# Patient Record
Sex: Female | Born: 1960 | Race: White | Hispanic: No | Marital: Married | Smoking: Former smoker
Health system: Southern US, Community
[De-identification: ages and names within clinical notes are randomized; demographics above are authoritative.]

## PROBLEM LIST (undated history)

## (undated) DIAGNOSIS — N200 Calculus of kidney: Secondary | ICD-10-CM

## (undated) HISTORY — PX: TUBAL LIGATION: SHX77

---

## 2004-11-26 ENCOUNTER — Ambulatory Visit: Payer: Self-pay | Admitting: Unknown Physician Specialty

## 2005-06-27 ENCOUNTER — Emergency Department: Payer: Self-pay | Admitting: Emergency Medicine

## 2005-09-11 ENCOUNTER — Ambulatory Visit: Payer: Self-pay | Admitting: Unknown Physician Specialty

## 2006-04-27 ENCOUNTER — Emergency Department: Payer: Self-pay | Admitting: Unknown Physician Specialty

## 2017-08-04 ENCOUNTER — Ambulatory Visit: Payer: Self-pay | Admitting: Physician Assistant

## 2018-03-14 ENCOUNTER — Other Ambulatory Visit: Payer: Self-pay

## 2018-03-14 ENCOUNTER — Emergency Department
Admission: EM | Admit: 2018-03-14 | Discharge: 2018-03-14 | Disposition: A | Discharge status: Home / Self Care | Payer: Self-pay | Attending: Emergency Medicine | Admitting: Emergency Medicine

## 2018-03-14 DIAGNOSIS — L237 Allergic contact dermatitis due to plants, except food: Secondary | ICD-10-CM

## 2018-03-14 DIAGNOSIS — L03115 Cellulitis of right lower limb: Secondary | ICD-10-CM | POA: Insufficient documentation

## 2018-03-14 MED ORDER — DEXAMETHASONE SODIUM PHOSPHATE 10 MG/ML IJ SOLN
10.0000 mg | Freq: Once | INTRAMUSCULAR | Status: AC
  Administered 2018-03-14: 10 mg via INTRAMUSCULAR

## 2018-03-14 MED ORDER — SILVER SULFADIAZINE 1 % EX CREA: TOPICAL_CREAM | CUTANEOUS | 1 refills | Status: AC

## 2018-03-14 MED ORDER — CEPHALEXIN 500 MG PO CAPS
ORAL_CAPSULE | ORAL | Status: AC
  Filled 2018-03-14: qty 1

## 2018-03-14 MED ORDER — PREDNISONE 10 MG PO TABS: 10.0000 mg | ORAL_TABLET | Freq: Every day | ORAL | 0 refills | Status: DC

## 2018-03-14 MED ORDER — CEPHALEXIN 500 MG PO CAPS: 500.0000 mg | ORAL_CAPSULE | Freq: Four times a day (QID) | ORAL | 0 refills | Status: DC

## 2018-03-14 MED ORDER — CEPHALEXIN 500 MG PO CAPS
500.0000 mg | ORAL_CAPSULE | Freq: Once | ORAL | Status: AC
  Administered 2018-03-14: 500 mg via ORAL

## 2018-03-14 MED ORDER — CEPHALEXIN 500 MG PO CAPS: 500.0000 mg | ORAL_CAPSULE | Freq: Four times a day (QID) | ORAL | 0 refills | Status: AC

## 2018-03-14 MED ORDER — DEXAMETHASONE SODIUM PHOSPHATE 10 MG/ML IJ SOLN
INTRAMUSCULAR | Status: AC
  Filled 2018-03-14: qty 1

## 2018-03-14 MED ORDER — PREDNISONE 10 MG PO TABS: 10.0000 mg | ORAL_TABLET | Freq: Every day | ORAL | 0 refills | Status: AC

## 2018-03-14 NOTE — ED Triage Notes (Signed)
Pt in with poison ivy to legs bilat.

## 2018-03-14 NOTE — ED Provider Notes (Signed)
[facility] EMERGENCY DEPARTMENT Provider Note   CSN: [id number] Arrival date & time: 03/14/18  2046     History   Chief Complaint Chief Complaint  Patient presents with  . Poison Ivy    HPI Leslie Sparks is a 57 y.o. female presents to the emergency department for evaluation of poison ivy.  Patient states she has poison ivy to both legs is been present for 3 to 4 days.  She is been applying over-the-counter topical agents with no relief.  She denies any chest pain, shortness of breath, facial swelling, difficulty swallowing.  No history of anaphylaxis to poison ivy.  She has been afebrile.  She noticed behind the right leg she is had more redness and warmth and more irritation.  HPI  No past medical history on file.  There are no active problems to display for this patient.  OB History   None      Home Medications    Prior to Admission medications   Medication Sig Start Date End Date Taking? Authorizing Provider  cephALEXin (KEFLEX) 500 MG capsule Take 1 capsule (500 mg total) by mouth 4 (four) times daily for 7 days. 03/14/18 03/21/18  Evon Slack, PA-C  predniSONE (DELTASONE) 10 MG tablet Take 1 tablet (10 mg total) by mouth daily. 6,5,4,3,2,1 six day taper 03/14/18   Evon Slack, PA-C    Family History No family history on file.  Social History Social History   Tobacco Use  . Smoking status: Not on file  Substance Use Topics  . Alcohol use: Not on file  . Drug use: Not on file     Allergies   Patient has no allergy information on record.   Review of Systems Review of Systems  Constitutional: Negative for fever.  Eyes: Negative for itching.  Musculoskeletal: Negative for arthralgias, back pain, gait problem, joint swelling and myalgias.  Skin: Positive for rash and wound.  Neurological: Negative for headaches.     Physical Exam Updated Vital Signs BP 122/81 (BP Location: Left Arm)   Pulse 74   Temp 98.5 F  (36.9 C) (Oral)   Resp 20   Ht 5\' 4"  (1.626 m)   Wt 63.5 kg   SpO2 99%   BMI 24.03 kg/m   Physical Exam  Constitutional: She is oriented to person, place, and time. She appears well-developed and well-nourished.  HENT:  Head: Normocephalic and atraumatic.  Eyes: Conjunctivae are normal.  Neck: Normal range of motion.  Cardiovascular: Normal rate.  Pulmonary/Chest: Effort normal. No respiratory distress.  Musculoskeletal: Normal range of motion.  No joint effusions noted.  Compartments are soft, negative Homans sign.  Patient with diffuse contact dermatitis along both lower extremities most severe in the popliteal region of the right leg.  There are multiple vesicles with erythematous base.  Left leg has less erythematous rash with mild vesicles that have ruptured and are slightly weeping with erythema.  Neurological: She is alert and oriented to person, place, and time.  Skin: Skin is warm. No rash noted.  Psychiatric: She has a normal mood and affect. Her behavior is normal. Thought content normal.     ED Treatments / Results  Labs (all labs ordered are listed, but only abnormal results are displayed) Labs Reviewed - No data to display  EKG None  Radiology No results found.  Procedures Procedures (including critical care time)  Medications Ordered in ED Medications  dexamethasone (DECADRON) injection 10 mg (has no administration in time  range)  cephALEXin (KEFLEX) capsule 500 mg (has no administration in time range)     Initial Impression / Assessment and Plan / ED Course  I have reviewed the triage vital signs and the nursing notes.  Pertinent labs & imaging results that were available during my care of the patient were reviewed by me and considered in my medical decision making (see chart for details).     57 year old female with contact dermatitis to both legs most likely due to poison ivy.  Patient states she was walking in the woods and she feels as if this  is from poison ivy.  She is been scratching significantly and applying over-the-counter creams with not much relief.  She is given dexamethasone IM with a 6-day steroid taper along with cephalexin.  She appears to have rash consistent with contact dermatitis may be developing a secondary cellulitis to the right popliteal region of the right knee.  Patient does not appear to have any joint effusion.  Vital signs are stable.  She is stable and ready for discharge.  Final Clinical Impressions(s) / ED Diagnoses   Final diagnoses:  Cellulitis of right lower extremity  Poison ivy dermatitis    ED Discharge Orders         Ordered    predniSONE (DELTASONE) 10 MG tablet  Daily     03/14/18 2109    cephALEXin (KEFLEX) 500 MG capsule  4 times daily     03/14/18 2109           Evon Slack, PA-C 03/14/18 2113    Arnaldo Natal, MD 03/15/18 [phone]

## 2018-03-14 NOTE — ED Notes (Signed)
Pt states she feels fine. Pt with pwd skin and unlabored resps currently. No longer feels faint. Vss.

## 2018-03-14 NOTE — Discharge Instructions (Signed)
Please avoid hot showers, continue with topical cream such as calamine lotion to help dry up dermatitis.  Take antibiotics and prednisone as prescribed.  Return to the emergency department for any fevers worsening symptoms or urgent changes in your health.

## 2018-03-14 NOTE — ED Notes (Signed)
Pt with near syncopal episode after injection. Pt assisted to strecher with staff x2. Blood pressure 73/42, hr 40. Pt pale, sweaty. Pt placed in trendelenberg with improvement of symptoms.

## 2018-07-09 ENCOUNTER — Emergency Department: Payer: Self-pay

## 2018-07-09 ENCOUNTER — Other Ambulatory Visit: Payer: Self-pay

## 2018-07-09 ENCOUNTER — Emergency Department
Admission: EM | Admit: 2018-07-09 | Discharge: 2018-07-09 | Disposition: A | Discharge status: Home / Self Care | Payer: Self-pay | Attending: Emergency Medicine | Admitting: Emergency Medicine

## 2018-07-09 ENCOUNTER — Encounter: Payer: Self-pay | Admitting: Intensive Care

## 2018-07-09 DIAGNOSIS — M7918 Myalgia, other site: Secondary | ICD-10-CM | POA: Insufficient documentation

## 2018-07-09 DIAGNOSIS — R3915 Urgency of urination: Secondary | ICD-10-CM | POA: Insufficient documentation

## 2018-07-09 DIAGNOSIS — Z87891 Personal history of nicotine dependence: Secondary | ICD-10-CM | POA: Insufficient documentation

## 2018-07-09 HISTORY — DX: Calculus of kidney: N20.0

## 2018-07-09 LAB — URINALYSIS, COMPLETE (UACMP) WITH MICROSCOPIC
BILIRUBIN URINE: NEGATIVE
GLUCOSE, UA: NEGATIVE mg/dL
Hgb urine dipstick: NEGATIVE
Ketones, ur: 5 mg/dL — AB
LEUKOCYTES UA: NEGATIVE
NITRITE: NEGATIVE
PH: 9 — AB (ref 5.0–8.0)
Protein, ur: 30 mg/dL — AB
SPECIFIC GRAVITY, URINE: 1.019 (ref 1.005–1.030)

## 2018-07-09 LAB — COMPREHENSIVE METABOLIC PANEL
ALBUMIN: 4 g/dL (ref 3.5–5.0)
ALT: 14 U/L (ref 0–44)
ANION GAP: 6 (ref 5–15)
AST: 12 U/L — ABNORMAL LOW (ref 15–41)
Alkaline Phosphatase: 61 U/L (ref 38–126)
BUN: 13 mg/dL (ref 6–20)
CALCIUM: 8.7 mg/dL — AB (ref 8.9–10.3)
CHLORIDE: 107 mmol/L (ref 98–111)
CO2: 27 mmol/L (ref 22–32)
Creatinine, Ser: 0.58 mg/dL (ref 0.44–1.00)
GFR calc non Af Amer: >60 mL/min (ref >60)
GLUCOSE: 114 mg/dL — AB (ref 70–99)
POTASSIUM: 3.9 mmol/L (ref 3.5–5.1)
SODIUM: 140 mmol/L (ref 135–145)
Total Bilirubin: 0.4 mg/dL (ref 0.3–1.2)
Total Protein: 6.9 g/dL (ref 6.5–8.1)

## 2018-07-09 LAB — CBC
HEMATOCRIT: 40.5 % (ref 36.0–46.0)
HEMOGLOBIN: 13.7 g/dL (ref 12.0–15.0)
MCH: 29.8 pg (ref 26.0–34.0)
MCHC: 33.8 g/dL (ref 30.0–36.0)
MCV: 88.2 fL (ref 80.0–100.0)
PLATELETS: 355 10*3/uL (ref 150–400)
RBC: 4.59 MIL/uL (ref 3.87–5.11)
RDW: 12 % (ref 11.5–15.5)
WBC: 9.8 10*3/uL (ref 4.0–10.5)
nRBC: 0 % (ref 0.0–0.2)

## 2018-07-09 LAB — LIPASE, BLOOD: Lipase: 22 U/L (ref 11–51)

## 2018-07-09 MED ORDER — TRAMADOL HCL 50 MG PO TABS: 50.0000 mg | ORAL_TABLET | Freq: Four times a day (QID) | ORAL | 0 refills | Status: DC | PRN

## 2018-07-09 MED ORDER — CYCLOBENZAPRINE HCL 5 MG PO TABS: 5.0000 mg | ORAL_TABLET | Freq: Three times a day (TID) | ORAL | 0 refills | Status: AC | PRN

## 2018-07-09 MED ORDER — KETOROLAC TROMETHAMINE 60 MG/2ML IM SOLN
60.0000 mg | Freq: Once | INTRAMUSCULAR | Status: AC
  Administered 2018-07-09: 60 mg via INTRAMUSCULAR
  Filled 2018-07-09: qty 2

## 2018-07-09 NOTE — ED Provider Notes (Signed)
[facility] Emergency Department Provider Note  Time seen: 3:10 PM  I have reviewed the triage vital signs and the nursing notes.   HISTORY  Chief Complaint Flank Pain    HPI Leslie Sparks is a 58 y.o. female with a past medical history of a kidney stone greater than 10 years ago, presents to the emergency department for left flank pain.  According to the patient for the past 1 month she has been experiencing intermittent left flank pain, acutely worse over the past 1 week, and even more acutely worse over the past 8 hours.  States it is somewhat worse with movement, but does not feel like a muscle per patient.  Patient states a history of kidney stones approximately 15 years ago and it feels somewhat similar to that although the patient denies any dysuria or hematuria.  Has noted mild urgency at times.  Denies any vomiting, has noted some loose stool from time to time.  No fever.   Past Medical History:  Diagnosis Date  . Kidney stones     There are no active problems to display for this patient.   Past Surgical History:  Procedure Laterality Date  . TUBAL LIGATION      Prior to Admission medications   Medication Sig Start Date End Date Taking? Authorizing Provider  predniSONE (DELTASONE) 10 MG tablet Take 1 tablet (10 mg total) by mouth daily. 6,5,4,3,2,1 six day taper 03/14/18   Evon Slack, PA-C  silver sulfADIAZINE (SILVADENE) 1 % cream Apply to affected area daily 03/14/18 03/14/19  Evon Slack, PA-C    No Known Allergies  History reviewed. No pertinent family history.  Social History Social History   Tobacco Use  . Smoking status: Former Smoker    Last attempt to quit: 2010    Years since quitting: 10.0  . Smokeless tobacco: Never Used  Substance Use Topics  . Alcohol use: Yes    Comment: occ  . Drug use: Never    Review of Systems Constitutional: Negative for fever Cardiovascular: Negative for chest pain. Respiratory:  Negative for shortness of breath. Gastrointestinal: Left flank pain/back pain.  Negative for vomiting occasional loose stool. Genitourinary: No dysuria or hematuria, occasional urgency. Musculoskeletal: Left back pain Skin: Negative for skin complaints  Neurological: Negative for headache All other ROS negative  ____________________________________________   PHYSICAL EXAM:  VITAL SIGNS: ED Triage Vitals  Enc Vitals Group     BP 07/09/18 1327 (!) 126/106     Pulse Rate 07/09/18 1327 77     Resp --      Temp 07/09/18 1327 97.9 F (36.6 C)     Temp Source 07/09/18 1327 Oral     SpO2 07/09/18 1327 100 %     Weight 07/09/18 1327 145 lb (65.8 kg)     Height 07/09/18 1327 5\' 5"  (1.651 m)     Head Circumference --      Peak Flow --      Pain Score 07/09/18 1342 8     Pain Loc --      Pain Edu? --      Excl. in GC? --    Constitutional: Alert and oriented.  Mild distress due to discomfort. Eyes: Normal exam ENT   Head: Normocephalic and atraumatic.   Mouth/Throat: Mucous membranes are moist. Cardiovascular: Normal rate, regular rhythm. No murmur Respiratory: Normal respiratory effort without tachypnea nor retractions. Breath sounds are clear  Gastrointestinal: Soft, very minimal left lower quadrant tenderness  palpation, no rebound guarding or distention.  No CVA tenderness. Musculoskeletal: Nontender with normal range of motion in all extremities.  Neurologic:  Normal speech and language. No gross focal neurologic deficits  Skin:  Skin is warm, dry and intact.  Psychiatric: Mood and affect are normal.  ____________________________________________    RADIOLOGY  CT is essentially negative  ____________________________________________   INITIAL IMPRESSION / ASSESSMENT AND PLAN / ED COURSE  Pertinent labs & imaging results that were available during my care of the patient were reviewed by me and considered in my medical decision making (see chart for  details).  Patient presents to the emergency department for left flank pain x1 month although worse over the past 1 week and even worse over the past 8 hours.  Differential at this time would include ureterolithiasis, UTI/pyelonephritis, musculoskeletal pain, colitis or diverticulitis.  We will obtain labs, CT renal scan, treat with Toradol and continue to closely monitor.  Patient agreeable to plan of care.  CT scan is essentially negative.  Patient's lab work-up and urinalysis is nonrevealing and reassuring.  Patient's description of the pain worse with movement especially with bending, highly suggest musculoskeletal pain especially with a reassuring work-up.  We will place the patient on a short course of Ultram and Flexeril.  I discussed not drinking alcohol or driving while taking these medications.  I also discussed orthopedic follow-up if the patient continues to have pain after the next 2 weeks.  Patient agreeable to plan of care.  ____________________________________________   FINAL CLINICAL IMPRESSION(S) / ED DIAGNOSES  Left flank pain Musculoskeletal pain   Minna AntisPaduchowski, Ein Rijo, MD 07/09/18 [phone]

## 2018-07-09 NOTE — ED Notes (Signed)
First RN: pt c/o flank pain x 2 weeks. Denies fever.

## 2018-07-09 NOTE — ED Triage Notes (Signed)
Patient c/o lower back pain with some radiation to front. Has urinary frequency with little results.

## 2018-07-11 LAB — URINE CULTURE

## 2021-08-11 ENCOUNTER — Emergency Department
Admission: EM | Admit: 2021-08-11 | Discharge: 2021-08-11 | Disposition: A | Discharge status: Home / Self Care | Payer: Self-pay | Attending: Emergency Medicine | Admitting: Emergency Medicine

## 2021-08-11 ENCOUNTER — Other Ambulatory Visit: Payer: Self-pay

## 2021-08-11 DIAGNOSIS — L0100 Impetigo, unspecified: Secondary | ICD-10-CM | POA: Insufficient documentation

## 2021-08-11 MED ORDER — MUPIROCIN CALCIUM 2 % EX CREA: 1.0000 "application " | TOPICAL_CREAM | Freq: Two times a day (BID) | CUTANEOUS | 0 refills | Status: AC

## 2021-08-11 MED ORDER — SULFAMETHOXAZOLE-TRIMETHOPRIM 800-160 MG PO TABS: 1.0000 | ORAL_TABLET | Freq: Two times a day (BID) | ORAL | 0 refills | Status: AC

## 2021-08-11 NOTE — ED Triage Notes (Signed)
Pt presents to ED with concerns for impetigo on chin. Pt does present with rash and open areas to the chin. Pt denies fevers or chins. Pt states this started Thursday. NAD noted.  ?

## 2021-08-11 NOTE — ED Provider Notes (Signed)
? ?  [facility] ?Provider Note ? ? ? Event Date/Time  ? First MD Initiated Contact with Patient 08/11/21 1750   ?  (approximate) ? ? ?History  ? ?Rash ? ? ?HPI ?Leslie Sparks is a 61 y.o. female   patient complain of rash anterior inferior mandible area.  Onset of complaint was 4 days ago.  Patient that lesion started crusted over last night.  States no improvement over-the-counter antibiotic creams. ? ?  ? ? ?Physical Exam  ? ?Triage Vital Signs: ?ED Triage Vitals [08/11/21 1727]  ?Enc Vitals Group  ?   BP (!) 140/99  ?   Pulse Rate 67  ?   Resp 18  ?   Temp 99 ?F (37.2 ?C)  ?   Temp Source Oral  ?   SpO2 95 %  ?   Weight   ?   Height   ?   Head Circumference   ?   Peak Flow   ?   Pain Score 4  ?   Pain Loc   ?   Pain Edu?   ?   Excl. in GC?   ? ? ?Most recent vital signs: ?Vitals:  ? 08/11/21 1727  ?BP: (!) 140/99  ?Pulse: 67  ?Resp: 18  ?Temp: 99 ?F (37.2 ?C)  ?SpO2: 95%  ? ? ?General: Awake, no distress.  ?CV:  Good peripheral perfusion.  ?Resp:  Normal effort.  ?Abd:  No distention.  ?Other:  Multiple honey load crusted lesions lower anterior mandible ? ? ?ED Results / Procedures / Treatments  ? ?Labs ?(all labs ordered are listed, but only abnormal results are displayed) ?Labs Reviewed - No data to display ? ? ?EKG ? ? ? ? ?RADIOLOGY ? ? ? ?PROCEDURES: ? ?Critical Care performed: No ? ?Procedures ? ? ?MEDICATIONS ORDERED IN ED: ?Medications - No data to display ? ? ?IMPRESSION / MDM / ASSESSMENT AND PLAN / ED COURSE  ?I reviewed the triage vital signs and the nursing notes. ?             ?               ? ?Differential diagnosis includes, but is not limited to, bacterial versus viral skin infection. ? ? ?  ? ? ?FINAL CLINICAL IMPRESSION(S) / ED DIAGNOSES  ? ?Final diagnoses:  ?Impetigo  ? ? ? ?Rx / DC Orders  ? ?ED Discharge Orders   ? ?      Ordered  ?  mupirocin cream (BACTROBAN) 2 %  2 times daily       ? 08/11/21 1757  ?  sulfamethoxazole-trimethoprim (BACTRIM DS) 800-160 MG tablet   2 times daily       ? 08/11/21 1757  ? ?  ?  ? ?  ? ? ? ?Note:  This document was prepared using Dragon voice recognition software and may include unintentional dictation errors.  ?  ?Joni Reining, PA-C ?08/11/21 1800 ? ?  ?Sharyn Creamer, MD ?08/11/21 2259 ? ?

## 2021-08-11 NOTE — Discharge Instructions (Signed)
Read and follow discharge care instructions.  Take medication as directed for ?

## 2021-09-16 ENCOUNTER — Emergency Department: Payer: Self-pay

## 2021-09-16 ENCOUNTER — Emergency Department
Admission: EM | Admit: 2021-09-16 | Discharge: 2021-09-16 | Disposition: A | Discharge status: Home / Self Care | Payer: Self-pay | Attending: Student in an Organized Health Care Education/Training Program | Admitting: Student in an Organized Health Care Education/Training Program

## 2021-09-16 ENCOUNTER — Other Ambulatory Visit: Payer: Self-pay

## 2021-09-16 DIAGNOSIS — W010XXA Fall on same level from slipping, tripping and stumbling without subsequent striking against object, initial encounter: Secondary | ICD-10-CM | POA: Insufficient documentation

## 2021-09-16 DIAGNOSIS — S52502A Unspecified fracture of the lower end of left radius, initial encounter for closed fracture: Secondary | ICD-10-CM

## 2021-09-16 MED ORDER — OXYCODONE-ACETAMINOPHEN 5-325 MG PO TABS: 1.0000 | ORAL_TABLET | ORAL | 0 refills | Status: AC | PRN

## 2021-09-16 NOTE — ED Triage Notes (Signed)
Pt states slipped and fell on a dog toy last night and injured her left wrist/FA, pt has noted swelling and deformity. States she had a head light out and scared to drive last night. ?

## 2021-09-16 NOTE — ED Notes (Signed)
Dc instructions and scripts reviewed with pt. Will follow up with orthopedic surgeon today.  ?

## 2021-09-16 NOTE — ED Provider Notes (Signed)
? ?[facility] ?Provider Note ? ? ? Event Date/Time  ? First MD Initiated Contact with Patient 09/16/21 [phone]   ?  (approximate) ? ? ?History  ? ?Wrist Pain ? ? ?HPI ? ?Leslie Sparks is a 61 y.o. female no significant past medical history presents to the ER for evaluation of left wrist and forearm pain that occurred after mechanical fall last night.  She was reaching out to pick up a dog toy and tripped.  Did not hit her head denies any neck pain.  No shoulder or elbow pain.  Has had previous surgery on the left elbow.  Not any blood thinners.  States the pain is minimal when she is not moving it. ?  ? ? ?Physical Exam  ? ?Triage Vital Signs: ?ED Triage Vitals [09/16/21 0718]  ?Enc Vitals Group  ?   BP (!) 148/94  ?   Pulse Rate 72  ?   Resp 17  ?   Temp 98.5 ?F (36.9 ?C)  ?   Temp Source Oral  ?   SpO2 99 %  ?   Weight   ?   Height   ?   Head Circumference   ?   Peak Flow   ?   Pain Score   ?   Pain Loc   ?   Pain Edu?   ?   Excl. in GC?   ? ? ?Most recent vital signs: ?Vitals:  ? 09/16/21 0718  ?BP: (!) 148/94  ?Pulse: 72  ?Resp: 17  ?Temp: 98.5 ?F (36.9 ?C)  ?SpO2: 99%  ? ? ? ?Constitutional: Alert  ?Eyes: Conjunctivae are normal.  ?Head: Atraumatic. ?Nose: No congestion/rhinnorhea. ?Mouth/Throat: Mucous membranes are moist.   ?Neck: Painless ROM.  ?Cardiovascular:   Good peripheral circulation. ?Respiratory: Normal respiratory effort.  No retractions.  ?Gastrointestinal: Soft and nontender.  ?Musculoskeletal:  dinner fork deformity to left distal wrist with swelling and ecchymosis, n/v intact ?Neurologic:  MAE spontaneously. No gross focal neurologic deficits are appreciated.  ?Skin:  Skin is warm, dry and intact. No rash noted. ?Psychiatric: Mood and affect are normal. Speech and behavior are normal. ? ? ? ?ED Results / Procedures / Treatments  ? ?Labs ?(all labs ordered are listed, but only abnormal results are displayed) ?Labs Reviewed - No data to  display ? ? ?EKG ? ? ? ? ?RADIOLOGY ?Please see ED Course for my review and interpretation. ? ?I personally reviewed all radiographic images ordered to evaluate for the above acute complaints and reviewed radiology reports and findings.  These findings were personally discussed with the patient.  Please see medical record for radiology report. ? ? ? ?PROCEDURES: ? ?Critical Care performed: No ? ?.Ortho Injury Treatment ? ?Date/Time: 09/16/2021 8:45 AM ?Performed by: Willy Eddy, MD ?Authorized by: Willy Eddy, MD  ? ?Consent:  ?  Consent obtained:  VerbalInjury location: forearm ?Location details: left forearm ?Injury type: fracture ?Fracture type: distal radius ?Pre-procedure neurovascular assessment: neurovascularly intact ?Splint type: sugar tong ?Splint Applied by: ED Provider ?Supplies used: Ortho-Glass ?Post-procedure neurovascular assessment: post-procedure neurovascularly intact ? ? ? ? ?MEDICATIONS ORDERED IN ED: ?Medications - No data to display ? ? ?IMPRESSION / MDM / ASSESSMENT AND PLAN / ED COURSE  ?I reviewed the triage vital signs and the nursing notes. ?             ?               ? ?Differential diagnosis includes, but  is not limited to, fracture, dislocation, contusion, sprain ? ?Patient presenting with left wrist pain as described above.  No other associated injury.  Suspect distal radius fracture. ? ? ?Clinical Course as of 09/16/21 0845  ?Mon Sep 16, 2021  ?0800 X-ray to my review and interpretation does not show any evidence of significant displaced fracture, will await formal radiology report. [PR]  ?0932 Left forearm immobilized with sugar-tong splint.  Will be given outpatient follow-up. [PR]  ?  ?Clinical Course User Index ?[PR] Willy Eddy, MD  ? ? ? ?FINAL CLINICAL IMPRESSION(S) / ED DIAGNOSES  ? ?Final diagnoses:  ?Closed fracture of distal end of left radius, unspecified fracture morphology, initial encounter  ? ? ? ?Rx / DC Orders  ? ?ED Discharge Orders   ? ?       Ordered  ?  oxyCODONE-acetaminophen (PERCOCET) 5-325 MG tablet  Every 4 hours PRN       ? 09/16/21 0841  ? ?  ?  ? ?  ? ? ? ?Note:  This document was prepared using Dragon voice recognition software and may include unintentional dictation errors. ? ?  ?Willy Eddy, MD ?09/16/21 0845 ? ?

## 2022-05-29 ENCOUNTER — Encounter: Payer: Self-pay | Admitting: Emergency Medicine

## 2022-05-29 ENCOUNTER — Emergency Department: Payer: Self-pay

## 2022-05-29 ENCOUNTER — Emergency Department
Admission: EM | Admit: 2022-05-29 | Discharge: 2022-05-29 | Disposition: A | Discharge status: Home / Self Care | Payer: Self-pay | Attending: Emergency Medicine | Admitting: Emergency Medicine

## 2022-05-29 ENCOUNTER — Other Ambulatory Visit: Payer: Self-pay

## 2022-05-29 DIAGNOSIS — R051 Acute cough: Secondary | ICD-10-CM

## 2022-05-29 DIAGNOSIS — Z20822 Contact with and (suspected) exposure to covid-19: Secondary | ICD-10-CM | POA: Insufficient documentation

## 2022-05-29 DIAGNOSIS — J101 Influenza due to other identified influenza virus with other respiratory manifestations: Secondary | ICD-10-CM | POA: Insufficient documentation

## 2022-05-29 DIAGNOSIS — F17200 Nicotine dependence, unspecified, uncomplicated: Secondary | ICD-10-CM | POA: Insufficient documentation

## 2022-05-29 LAB — RESP PANEL BY RT-PCR (RSV, FLU A&B, COVID)  RVPGX2
Influenza A by PCR: POSITIVE — AB
Influenza B by PCR: NEGATIVE
Resp Syncytial Virus by PCR: NEGATIVE
SARS Coronavirus 2 by RT PCR: NEGATIVE

## 2022-05-29 NOTE — ED Provider Triage Note (Signed)
Emergency Medicine Provider Triage Evaluation Note  Leslie Sparks, a 61 y.o. female  was evaluated in triage.  Pt complains of cough, congestion, and fevers.  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  Review of Systems  Positive: Cough, fevers Negative: CP, SOB  Physical Exam  BP 129/78 (BP Location: Left Arm)   Pulse 92   Temp 98.1 F (36.7 C) (Oral)   Resp 18   Ht 5\' 5"  (1.651 m)   Wt 61.2 kg   SpO2 96%   BMI 22.47 kg/m  Gen:   Awake, no distress  NAD Resp:  Normal effort CTA MSK:   Moves extremities without difficulty  Other:    Medical Decision Making  Medically screening exam initiated at 1:25 PM.  Appropriate orders placed.  E Frerking was informed that the remainder of the evaluation will be completed by another provider, this initial triage assessment does not replace that evaluation, and the importance of remaining in the ED until their evaluation is complete.  Patient to the ED for evaluation of productive cough the last week with concern for possible viral infection exposure.   [place], PA-C 05/29/22 1326

## 2022-05-29 NOTE — ED Provider Notes (Signed)
[facility] Provider Note    Event Date/Time   First MD Initiated Contact with Patient 05/29/22 1519     (approximate)   History   Cough   HPI  Leslie Sparks is a 61 y.o. female with no significant past medical history who presents with cough.  Symptoms been going on for about 10 days to 2 weeks.  She has frequent cough especially at night.  Is productive of clear sputum.  Had low-grade fever yesterday.  Denies significant headache body aches.  No significant congestion.  Occasionally feels short of breath.  Not having hemoptysis.  No lower extremity swelling.  She smokes occasionally.  Denies sick contacts     Past Medical History:  Diagnosis Date   Kidney stones     There are no problems to display for this patient.    Physical Exam  Triage Vital Signs: ED Triage Vitals  Enc Vitals Group     BP 05/29/22 1317 129/78     Pulse Rate 05/29/22 1317 92     Resp 05/29/22 1317 18     Temp 05/29/22 1317 98.1 F (36.7 C)     Temp Source 05/29/22 1317 Oral     SpO2 05/29/22 1317 96 %     Weight 05/29/22 1315 135 lb (61.2 kg)     Height 05/29/22 1315 5\' 5"  (1.651 m)     Head Circumference --      Peak Flow --      Pain Score 05/29/22 1315 0     Pain Loc --      Pain Edu? --      Excl. in GC? --     Most recent vital signs: Vitals:   05/29/22 1317  BP: 129/78  Pulse: 92  Resp: 18  Temp: 98.1 F (36.7 C)  SpO2: 96%     General: Awake, no distress.  CV:  Good peripheral perfusion.  Resp:  Normal effort.  Lungs are clear no increased work of breathing Abd:  No distention.  Neuro:             Awake, Alert, Oriented x 3  Other:  No lower extremity swelling   ED Results / Procedures / Treatments  Labs (all labs ordered are listed, but only abnormal results are displayed) Labs Reviewed  RESP PANEL BY RT-PCR (RSV, FLU A&B, COVID)  RVPGX2 - Abnormal; Notable for the following components:      Result Value   Influenza A by PCR  POSITIVE (*)    All other components within normal limits     EKG     RADIOLOGY I reviewed and interpreted the CXR which does not show any acute cardiopulmonary process    PROCEDURES:  Critical Care performed: No  Procedures   MEDICATIONS ORDERED IN ED: Medications - No data to display   IMPRESSION / MDM / ASSESSMENT AND PLAN / ED COURSE  I reviewed the triage vital signs and the nursing notes.                              Patient's presentation is most consistent with acute complicated illness / injury requiring diagnostic workup.  Differential diagnosis includes, but is not limited to, bacterial pneumonia, viral illness, postviral cough, postnasal drip, GERD  Patient is 61 year old female who presents with about 2 weeks of cough that is productive of clear sputum.  Occasionally feels short of breath.  Felt  she had a low-grade fever yesterday.  She is not hypoxic or febrile vital signs are within normal limits.  Patient looks well is not in respiratory distress.  Lungs are clear my exam.  She is positive for flu A.  Chest x-ray obtained given duration of symptoms to rule out a post flu pneumonia and this is clear.  No indication for antibiotics at this time.  We discussed supportive measures for cough.  She is appropriate for discharge.       FINAL CLINICAL IMPRESSION(S) / ED DIAGNOSES   Final diagnoses:  Acute cough  Influenza A     Rx / DC Orders   ED Discharge Orders     None        Note:  This document was prepared using Dragon voice recognition software and may include unintentional dictation errors.   Georga Hacking, MD 05/29/22 [phone]

## 2022-05-29 NOTE — ED Triage Notes (Signed)
Patient to ED via POV for a cough x1 week. Patient states it is productive with white mucous. Has taken over the counter meds with no relief.

## 2022-05-29 NOTE — Discharge Instructions (Addendum)
Your influenza test was positive which is likely the cause of your cough.  You do not have pneumonia on your chest x-ray.  Continue over-the-counter medications for cough make sure you are staying hydrated.  Please follow-up with your primary care provider.

## 2022-07-02 ENCOUNTER — Other Ambulatory Visit: Payer: Self-pay

## 2022-07-02 ENCOUNTER — Emergency Department
Admission: EM | Admit: 2022-07-02 | Discharge: 2022-07-02 | Disposition: A | Discharge status: Home / Self Care | Payer: Self-pay | Attending: Emergency Medicine | Admitting: Emergency Medicine

## 2022-07-02 DIAGNOSIS — H109 Unspecified conjunctivitis: Secondary | ICD-10-CM | POA: Insufficient documentation

## 2022-07-02 MED ORDER — ERYTHROMYCIN 5 MG/GM OP OINT: 1.0000 | TOPICAL_OINTMENT | Freq: Every day | OPHTHALMIC | 0 refills | Status: AC

## 2022-07-02 NOTE — ED Triage Notes (Signed)
Pt comes with c/o bilateral eye pain redness. Pt states she knows it is pink eye and just needs prescription.

## 2022-07-02 NOTE — ED Provider Notes (Signed)
   [facility] Provider Note    Event Date/Time   First MD Initiated Contact with Patient 07/02/22 1627     (approximate)   History   Eye Pain   HPI  [place] is a 62 y.o. female who presents with red eyes bilaterally and has noted some discharge in the mornings.  She thinks she has conjunctivitis and is requesting medication.  She works in a daycare     Physical Exam   Triage Vital Signs: ED Triage Vitals [07/02/22 1515]  Enc Vitals Group     BP (!) 147/69     Pulse Rate 75     Resp 18     Temp 98.2 F (36.8 C)     Temp src      SpO2 97 %     Weight      Height      Head Circumference      Peak Flow      Pain Score 3     Pain Loc      Pain Edu?      Excl. in [place]?     Most recent vital signs: Vitals:   07/02/22 1515  BP: (!) 147/69  Pulse: 75  Resp: 18  Temp: 98.2 F (36.8 C)  SpO2: 97%     General: Awake, no distress.  CV:  Good peripheral perfusion.  Resp:  Normal effort.  Abd:  No distention.  Other:  Eyes: Mild conjunctivitis bilaterally   ED Results / Procedures / Treatments   Labs (all labs ordered are listed, but only abnormal results are displayed) Labs Reviewed - No data to display   EKG     RADIOLOGY     PROCEDURES:  Critical Care performed:   Procedures   MEDICATIONS ORDERED IN ED: Medications - No data to display   IMPRESSION / MDM / [place] / ED COURSE  I reviewed the triage vital signs and the nursing notes. Patient's presentation is most consistent with acute, uncomplicated illness.   Erythromycin ointment prescription provided       FINAL CLINICAL IMPRESSION(S) / ED DIAGNOSES   Final diagnoses:  Conjunctivitis of both eyes, unspecified conjunctivitis type     Rx / DC Orders   ED Discharge Orders          Ordered    erythromycin ophthalmic ointment  Daily at bedtime        07/02/22 1630             Note:  This document was prepared using  Dragon voice recognition software and may include unintentional dictation errors.   Lavonia Drafts, MD 07/02/22 [phone]

## 2023-06-08 IMAGING — DX DG WRIST COMPLETE 3+V*L*
4 series · 4 of 4 positions shown · non-contrast
Comparison: None.

CLINICAL DATA: Slipped and fell yesterday.  Swelling and deformity.

EXAM:
LEFT WRIST - COMPLETE 3+ VIEW

[wrist ap]
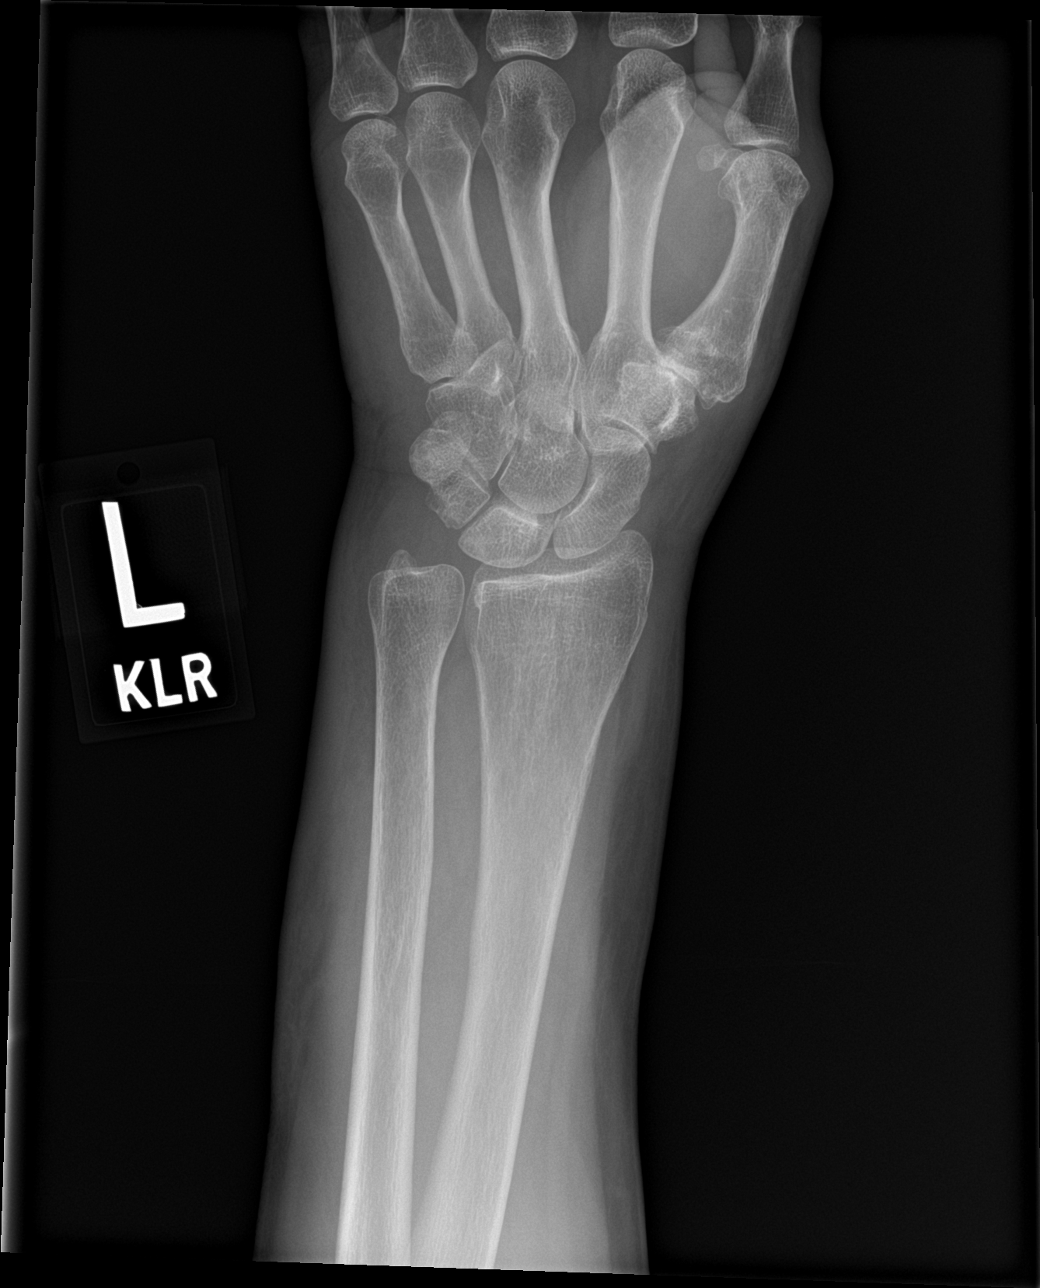

[wrist obl (1 of 2)]
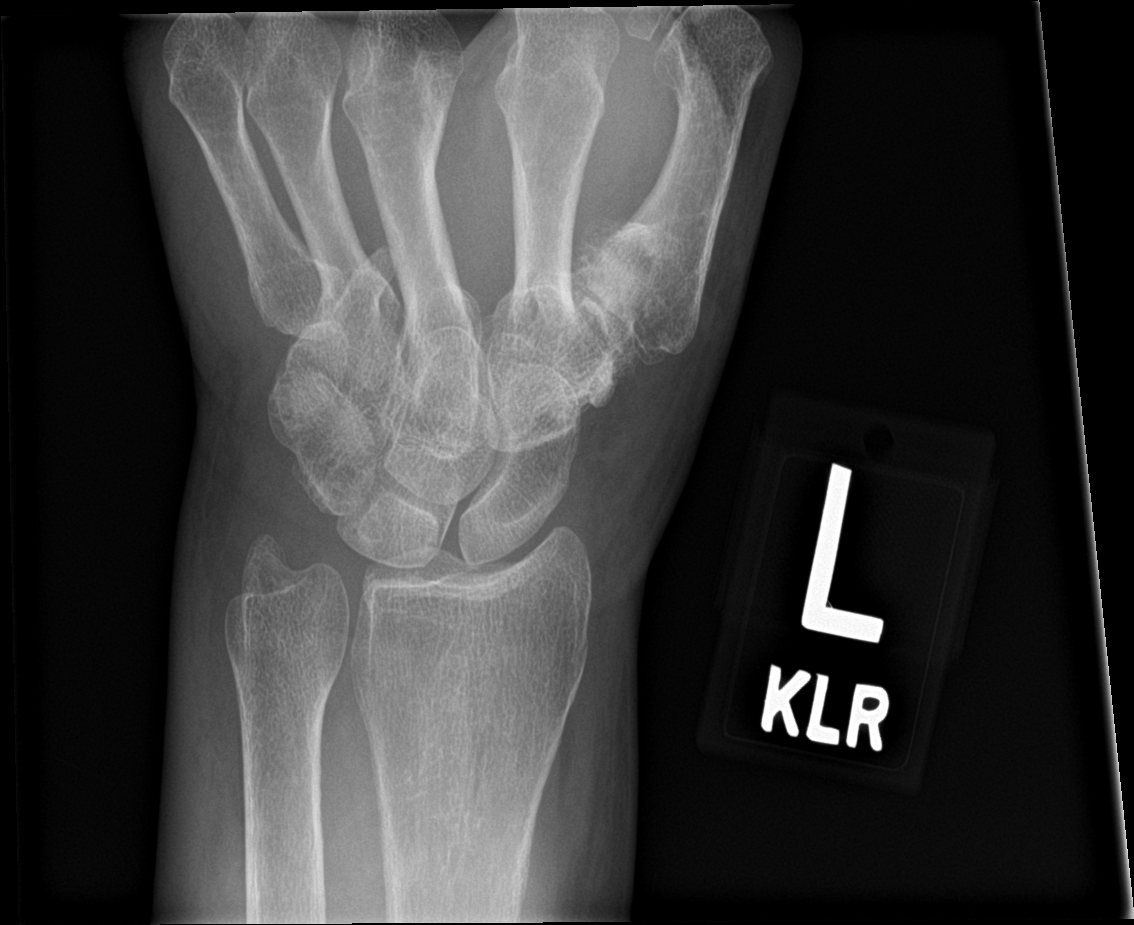

[wrist lat]
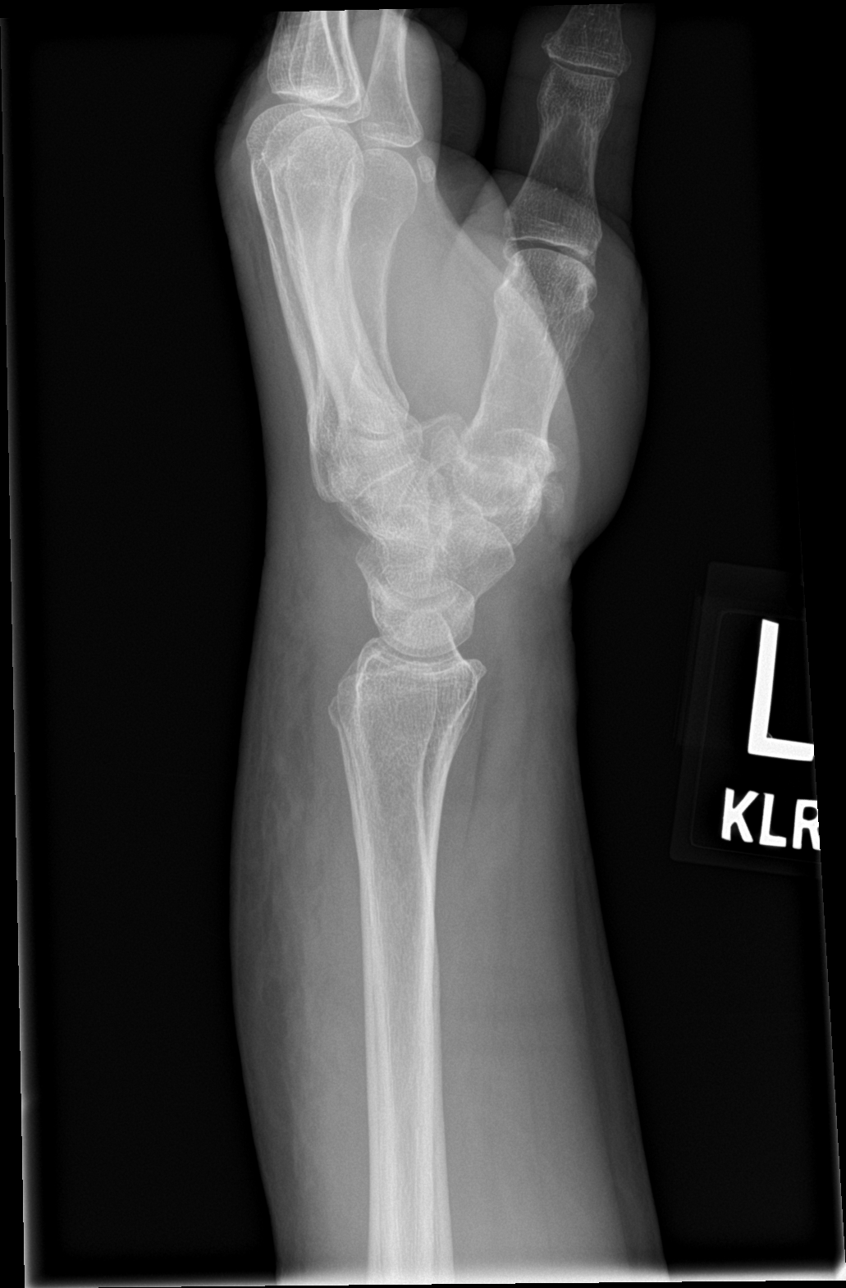

[wrist obl (2 of 2)]
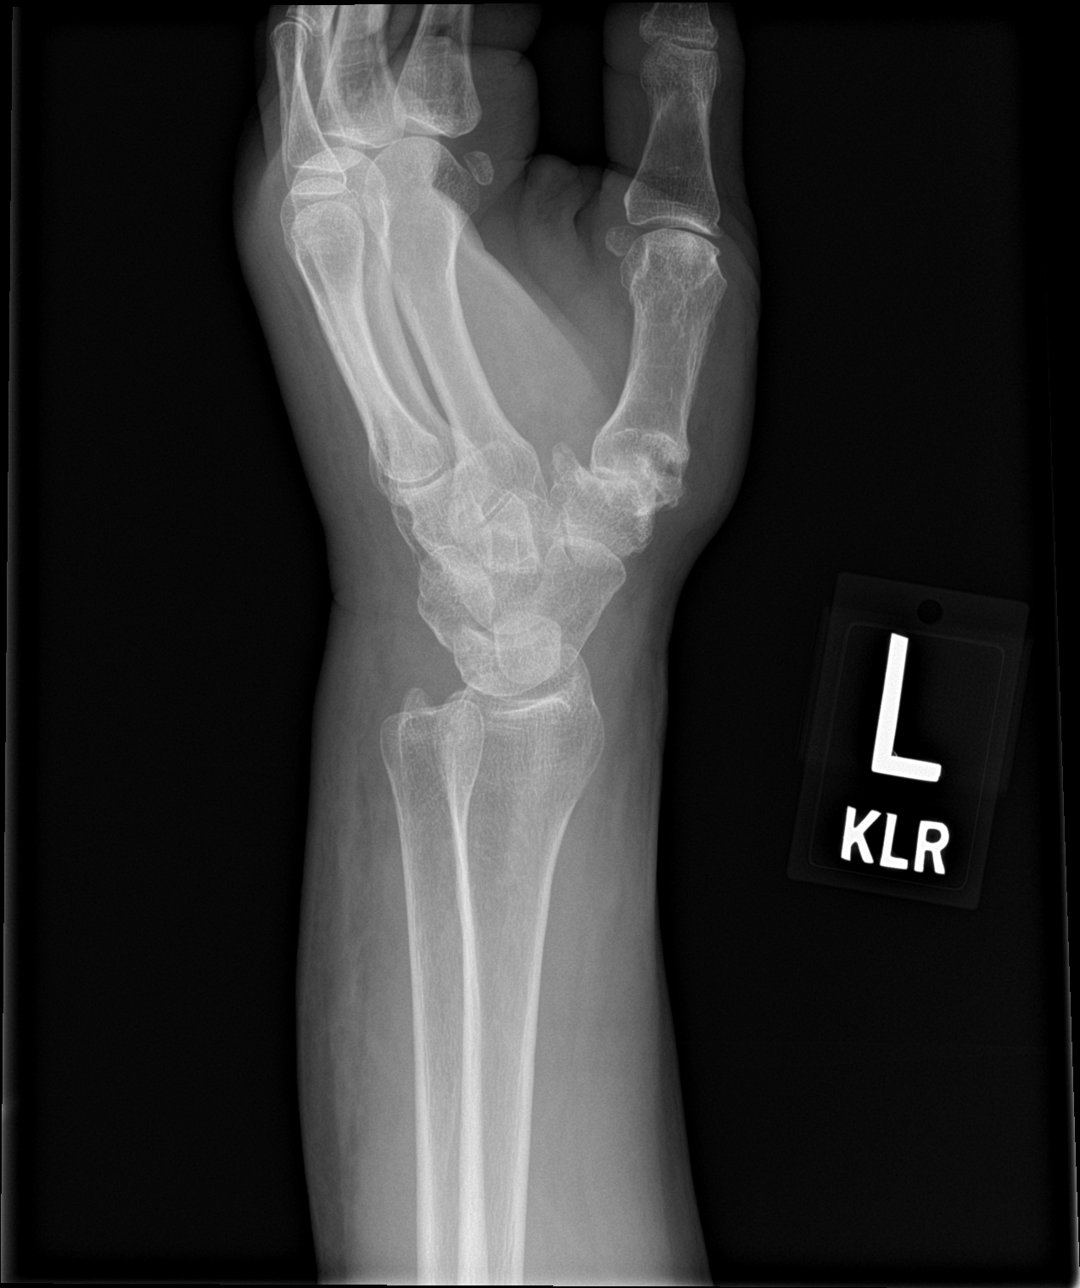

[4 of 4 positions shown; findings below may reference images not displayed]

FINDINGS: Pronounced distal soft tissue swelling. Suspicion of minimal dorsal
cortex buckle fracture of the distal radius. No ulnar styloid
fracture. No acute carpal finding. Chronic degenerative changes at
the first carpometacarpal joint.
IMPRESSION: Suspicion of minimal dorsal cortex buckle fracture of the distal
radius. Regional soft tissue swelling.

Chronic degenerative changes of the first carpometacarpal joint.

## 2024-05-09 ENCOUNTER — Emergency Department
Admission: EM | Admit: 2024-05-09 | Discharge: 2024-05-09 | Disposition: A | Discharge status: Home / Self Care | Attending: Emergency Medicine | Admitting: Emergency Medicine

## 2024-05-09 ENCOUNTER — Other Ambulatory Visit: Payer: Self-pay

## 2024-05-09 DIAGNOSIS — J028 Acute pharyngitis due to other specified organisms: Secondary | ICD-10-CM | POA: Diagnosis not present

## 2024-05-09 DIAGNOSIS — J029 Acute pharyngitis, unspecified: Secondary | ICD-10-CM | POA: Diagnosis present

## 2024-05-09 DIAGNOSIS — B9789 Other viral agents as the cause of diseases classified elsewhere: Secondary | ICD-10-CM | POA: Insufficient documentation

## 2024-05-09 LAB — GROUP A STREP BY PCR: Group A Strep by PCR: NOT DETECTED

## 2024-05-09 MED ORDER — ALUM & MAG HYDROXIDE-SIMETH 200-200-20 MG/5ML PO SUSP
30.0000 mL | Freq: Once | ORAL | Status: AC
  Administered 2024-05-09: 30 mL via ORAL
  Filled 2024-05-09: qty 30

## 2024-05-09 MED ORDER — LIDOCAINE VISCOUS HCL 2 % MT SOLN: 15.0000 mL | OROMUCOSAL | 0 refills | Status: AC | PRN

## 2024-05-09 MED ORDER — LIDOCAINE VISCOUS HCL 2 % MT SOLN
15.0000 mL | Freq: Once | OROMUCOSAL | Status: AC
  Administered 2024-05-09: 15 mL via ORAL
  Filled 2024-05-09: qty 15

## 2024-05-09 NOTE — ED Triage Notes (Signed)
 Pt reports sore throat cough and congestion for the past few days.

## 2024-05-09 NOTE — Discharge Instructions (Addendum)
 You have been diagnosed with viral pharyngitis.  Strep a came back negative.  Please take ibuprofen or acetaminophen  every 8 hours for sore throat and bodyaches.  Please take lidocaine for sore throat.  If the symptoms persist please go to your PCP or come back here to ED.  It was a pleasure to help you today.  Jasmeet Gehl, PA-C

## 2024-05-09 NOTE — ED Provider Notes (Signed)
 [facility] Provider Note    Event Date/Time   First MD Initiated Contact with Patient 05/09/24 2042     (approximate)   History   Sore Throat    HPI  Leslie Sparks is a 63 y.o. female    with a past medical history of cough, impetigo, cellulitis, who presents to the ED complaining of cough and congestion.. According to the patient, symptoms started 24 hours ago with sore throat, dry cough, chills.  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  Patient is here by herself.     There are no active problems to display for this patient.    Physical Exam   Triage Vital Signs: ED Triage Vitals [05/09/24 1950]  Encounter Vitals Group     BP (!) 144/74     Girls Systolic BP Percentile      Girls Diastolic BP Percentile      Boys Systolic BP Percentile      Boys Diastolic BP Percentile      Pulse Rate 79     Resp 18     Temp 98.4 F (36.9 C)     Temp src      SpO2 100 %     Weight 135 lb (61.2 kg)     Height 5' 4 (1.626 m)     Head Circumference      Peak Flow      Pain Score 7     Pain Loc      Pain Education      Exclude from Growth Chart     Most recent vital signs: Vitals:   05/09/24 1950  BP: (!) 144/74  Pulse: 79  Resp: 18  Temp: 98.4 F (36.9 C)  SpO2: 100%     Physical Exam Vitals and nursing note reviewed.  In triage patient has systolic hypertension.  General:          Awake, no distress.  Throat: Presence of peritonsillar erythema, no petechia's, no exudates.  No peritonsillar enlargement. CV:                  Good peripheral perfusion. Regular rate and rhythm. Resp:               Normal effort. no tachypnea.Equal breath sounds bilaterally.  No wheezing Abd:                 No distention.  Soft nontender          ED Results / Procedures / Treatments   Labs (all labs ordered are listed, but only abnormal results are displayed) Labs Reviewed  GROUP A STREP BY PCR    PROCEDURES:  Critical Care performed:    Procedures   MEDICATIONS ORDERED IN ED: Medications  alum & mag hydroxide-simeth (MAALOX/MYLANTA) 200-200-20 MG/5ML suspension 30 mL (has no administration in time range)    And  lidocaine (XYLOCAINE) 2 % viscous mouth solution 15 mL (has no administration in time range)   Clinical Course as of 05/09/24 2128  Mon May 09, 2024  2121 Group A Strep by PCR ([facility] Only) Negative [AE]  2121 Updated patient with results of strep  A.  Patient complains of sore throat.  I will order Maalox with lidocaine viscous.  Patient is going to be discharged with lidocaine viscous.  She is agreeable with the plan [AE]    Clinical Course User Index [AE] Janit Kast, PA-C    IMPRESSION / MDM /  ASSESSMENT AND PLAN / ED COURSE  I reviewed the triage vital signs and the nursing notes.  Differential diagnosis includes, but is not limited to, pharyngitis, viral illness, strep A, unlikely GERD  Patient's presentation is most consistent with acute complicated illness / injury requiring diagnostic workup.   Leslie Sparks is a 64 y.o., female who presents today with history of 24 hours of sore throat, dry cough, chills.  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  On a physical exam vital signs were normal during triage, throat presence of peritonsillar erythema, no tonsillar enlargement, no petechia, no exudate.  Rest of the physical exam is normal.  Plan Wait for results of the strep A ordered during triage. Strep is negative, patient complains of sore throat.  I will order Maalox with lidocaine. Patient's diagnosis is consistent with viral pharyngitis. I  Labs are  reassuring ruling out strep a. I did review the patient's allergies and medications.The patient is in stable and satisfactory condition for discharge home  Patient will be discharged home with prescriptions for lidocaine discussed. Patient is to follow up with PCP as needed or otherwise directed. Patient is given ED precautions to return to  the ED for any worsening or new symptoms. Discussed plan of care with patient, answered all of patient's questions, patient agreeable to plan of care. Advised patient to take medications according to the instructions on the label. Discussed possible side effects of new medications. Patient verbalized understanding.  FINAL CLINICAL IMPRESSION(S) / ED DIAGNOSES   Final diagnoses:  Viral pharyngitis     Rx / DC Orders   ED Discharge Orders          Ordered    lidocaine (XYLOCAINE) 2 % solution  As needed        05/09/24 2126             Note:  This document was prepared using Dragon voice recognition software and may include unintentional dictation errors.   Janit Kast, PA-C 05/09/24 2128    Dorothyann Drivers, MD 05/09/24 2245

## 2024-05-09 NOTE — ED Notes (Signed)
 Pt refusing Covid swab at this time.
